# Patient Record
Sex: Female | Born: 1975
Health system: Southern US, Community
[De-identification: ages and names within clinical notes are randomized; demographics above are authoritative.]

## PROBLEM LIST (undated history)

## (undated) DIAGNOSIS — K649 Unspecified hemorrhoids: Secondary | ICD-10-CM

## (undated) DIAGNOSIS — K219 Gastro-esophageal reflux disease without esophagitis: Secondary | ICD-10-CM

## (undated) HISTORY — DX: Unspecified hemorrhoids: K64.9

## (undated) HISTORY — PX: MOUTH SURGERY: SHX715

## (undated) HISTORY — DX: Gastro-esophageal reflux disease without esophagitis: K21.9

---

## 2002-10-08 ENCOUNTER — Emergency Department (HOSPITAL_COMMUNITY): Admission: EM | Admit: 2002-10-08 | Discharge: 2002-10-09 | Payer: Self-pay | Admitting: Emergency Medicine

## 2005-06-17 ENCOUNTER — Other Ambulatory Visit: Admission: RE | Admit: 2005-06-17 | Discharge: 2005-06-17 | Payer: Self-pay | Admitting: Obstetrics and Gynecology

## 2007-04-28 ENCOUNTER — Other Ambulatory Visit: Admission: RE | Admit: 2007-04-28 | Discharge: 2007-04-28 | Payer: Self-pay | Admitting: Family Medicine

## 2015-05-16 ENCOUNTER — Emergency Department (HOSPITAL_BASED_OUTPATIENT_CLINIC_OR_DEPARTMENT_OTHER)
Admission: EM | Admit: 2015-05-16 | Discharge: 2015-05-16 | Disposition: A | Discharge status: Home / Self Care | Payer: No Typology Code available for payment source | Attending: Emergency Medicine | Admitting: Emergency Medicine

## 2015-05-16 ENCOUNTER — Encounter (HOSPITAL_BASED_OUTPATIENT_CLINIC_OR_DEPARTMENT_OTHER): Payer: Self-pay | Admitting: Emergency Medicine

## 2015-05-16 DIAGNOSIS — S40012A Contusion of left shoulder, initial encounter: Secondary | ICD-10-CM | POA: Diagnosis not present

## 2015-05-16 DIAGNOSIS — S99911A Unspecified injury of right ankle, initial encounter: Secondary | ICD-10-CM | POA: Diagnosis present

## 2015-05-16 DIAGNOSIS — Y9389 Activity, other specified: Secondary | ICD-10-CM | POA: Insufficient documentation

## 2015-05-16 DIAGNOSIS — Z880 Allergy status to penicillin: Secondary | ICD-10-CM | POA: Diagnosis not present

## 2015-05-16 DIAGNOSIS — Z79899 Other long term (current) drug therapy: Secondary | ICD-10-CM | POA: Insufficient documentation

## 2015-05-16 DIAGNOSIS — Y92410 Unspecified street and highway as the place of occurrence of the external cause: Secondary | ICD-10-CM | POA: Insufficient documentation

## 2015-05-16 DIAGNOSIS — S93401A Sprain of unspecified ligament of right ankle, initial encounter: Secondary | ICD-10-CM

## 2015-05-16 DIAGNOSIS — Y998 Other external cause status: Secondary | ICD-10-CM | POA: Insufficient documentation

## 2015-05-16 MED ORDER — HYDROCODONE-ACETAMINOPHEN 5-325 MG PO TABS: 1.0000 | ORAL_TABLET | Freq: Four times a day (QID) | ORAL | Status: DC | PRN
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
## 2016-07-23 ENCOUNTER — Ambulatory Visit (HOSPITAL_BASED_OUTPATIENT_CLINIC_OR_DEPARTMENT_OTHER): Payer: BLUE CROSS/BLUE SHIELD

## 2017-11-29 ENCOUNTER — Other Ambulatory Visit: Payer: Self-pay | Admitting: Internal Medicine

## 2017-11-29 DIAGNOSIS — N926 Irregular menstruation, unspecified: Secondary | ICD-10-CM

## 2017-11-29 DIAGNOSIS — R102 Pelvic and perineal pain: Secondary | ICD-10-CM

## 2017-12-07 ENCOUNTER — Ambulatory Visit
Admission: RE | Admit: 2017-12-07 | Discharge: 2017-12-07 | Disposition: A | Discharge status: Home / Self Care | Payer: BLUE CROSS/BLUE SHIELD | Source: Ambulatory Visit | Attending: Internal Medicine | Admitting: Internal Medicine

## 2017-12-07 DIAGNOSIS — N926 Irregular menstruation, unspecified: Secondary | ICD-10-CM

## 2017-12-07 DIAGNOSIS — R102 Pelvic and perineal pain: Secondary | ICD-10-CM

## 2018-04-13 DIAGNOSIS — R51 Headache: Secondary | ICD-10-CM | POA: Diagnosis not present

## 2018-04-19 DIAGNOSIS — G44209 Tension-type headache, unspecified, not intractable: Secondary | ICD-10-CM | POA: Diagnosis not present

## 2018-04-19 DIAGNOSIS — M9902 Segmental and somatic dysfunction of thoracic region: Secondary | ICD-10-CM | POA: Diagnosis not present

## 2018-04-19 DIAGNOSIS — M542 Cervicalgia: Secondary | ICD-10-CM | POA: Diagnosis not present

## 2018-04-19 DIAGNOSIS — M9901 Segmental and somatic dysfunction of cervical region: Secondary | ICD-10-CM | POA: Diagnosis not present

## 2018-04-28 DIAGNOSIS — Z7689 Persons encountering health services in other specified circumstances: Secondary | ICD-10-CM | POA: Diagnosis not present

## 2018-04-28 DIAGNOSIS — J302 Other seasonal allergic rhinitis: Secondary | ICD-10-CM | POA: Diagnosis not present

## 2018-05-10 DIAGNOSIS — M9903 Segmental and somatic dysfunction of lumbar region: Secondary | ICD-10-CM | POA: Diagnosis not present

## 2018-05-10 DIAGNOSIS — M47816 Spondylosis without myelopathy or radiculopathy, lumbar region: Secondary | ICD-10-CM | POA: Diagnosis not present

## 2018-05-10 DIAGNOSIS — M9901 Segmental and somatic dysfunction of cervical region: Secondary | ICD-10-CM | POA: Diagnosis not present

## 2018-05-10 DIAGNOSIS — M542 Cervicalgia: Secondary | ICD-10-CM | POA: Diagnosis not present

## 2018-06-10 DIAGNOSIS — M47816 Spondylosis without myelopathy or radiculopathy, lumbar region: Secondary | ICD-10-CM | POA: Diagnosis not present

## 2018-06-10 DIAGNOSIS — M9903 Segmental and somatic dysfunction of lumbar region: Secondary | ICD-10-CM | POA: Diagnosis not present

## 2018-06-10 DIAGNOSIS — M9901 Segmental and somatic dysfunction of cervical region: Secondary | ICD-10-CM | POA: Diagnosis not present

## 2018-06-10 DIAGNOSIS — M542 Cervicalgia: Secondary | ICD-10-CM | POA: Diagnosis not present

## 2018-06-25 DIAGNOSIS — R5383 Other fatigue: Secondary | ICD-10-CM | POA: Diagnosis not present

## 2018-07-01 DIAGNOSIS — Z0000 Encounter for general adult medical examination without abnormal findings: Secondary | ICD-10-CM | POA: Diagnosis not present

## 2018-07-01 DIAGNOSIS — E559 Vitamin D deficiency, unspecified: Secondary | ICD-10-CM | POA: Diagnosis not present

## 2018-07-14 DIAGNOSIS — M47816 Spondylosis without myelopathy or radiculopathy, lumbar region: Secondary | ICD-10-CM | POA: Diagnosis not present

## 2018-07-14 DIAGNOSIS — M542 Cervicalgia: Secondary | ICD-10-CM | POA: Diagnosis not present

## 2018-07-14 DIAGNOSIS — M9901 Segmental and somatic dysfunction of cervical region: Secondary | ICD-10-CM | POA: Diagnosis not present

## 2018-07-14 DIAGNOSIS — M9903 Segmental and somatic dysfunction of lumbar region: Secondary | ICD-10-CM | POA: Diagnosis not present

## 2018-08-25 DIAGNOSIS — M9903 Segmental and somatic dysfunction of lumbar region: Secondary | ICD-10-CM | POA: Diagnosis not present

## 2018-08-25 DIAGNOSIS — M542 Cervicalgia: Secondary | ICD-10-CM | POA: Diagnosis not present

## 2018-08-25 DIAGNOSIS — M47816 Spondylosis without myelopathy or radiculopathy, lumbar region: Secondary | ICD-10-CM | POA: Diagnosis not present

## 2018-08-25 DIAGNOSIS — M9901 Segmental and somatic dysfunction of cervical region: Secondary | ICD-10-CM | POA: Diagnosis not present

## 2018-09-18 IMAGING — US US PELVIS COMPLETE TRANSABD/TRANSVAG
1 series · 14 of 25 positions shown · non-contrast
Comparison: None

CLINICAL DATA: Irregular menses.  Pelvic pain for 2 months.

EXAM:
TRANSABDOMINAL AND TRANSVAGINAL ULTRASOUND OF PELVIS
TECHNIQUE: Both transabdominal and transvaginal ultrasound examinations of the
pelvis were performed. Transabdominal technique was performed for
global imaging of the pelvis including uterus, ovaries, adnexal
regions, and pelvic cul-de-sac. It was necessary to proceed with
endovaginal exam following the transabdominal exam to visualize the
endometrium and ovaries.

[Series 1: us pelvis complete transabd/transvag · 0.17mm/px · 14 of 77 slices shown]
[im 1/77]
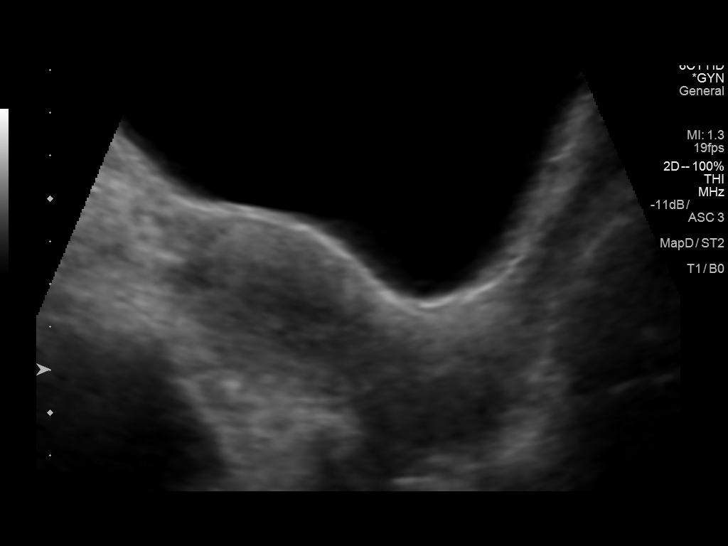
[im 7/77]
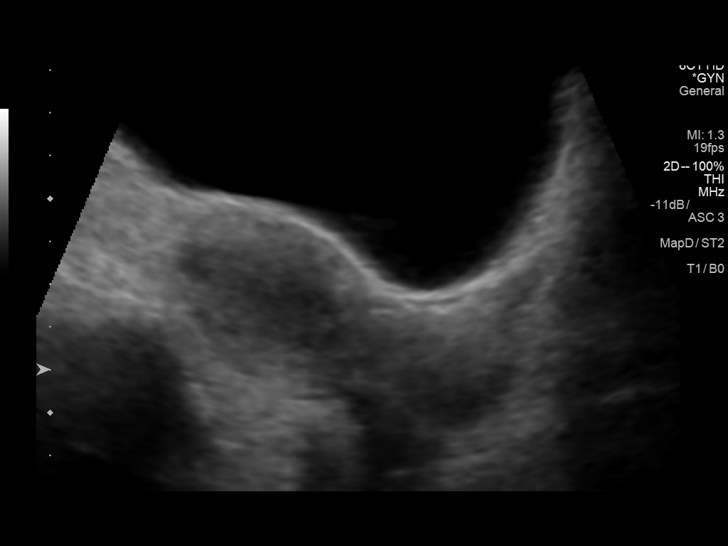
[im 13/77]
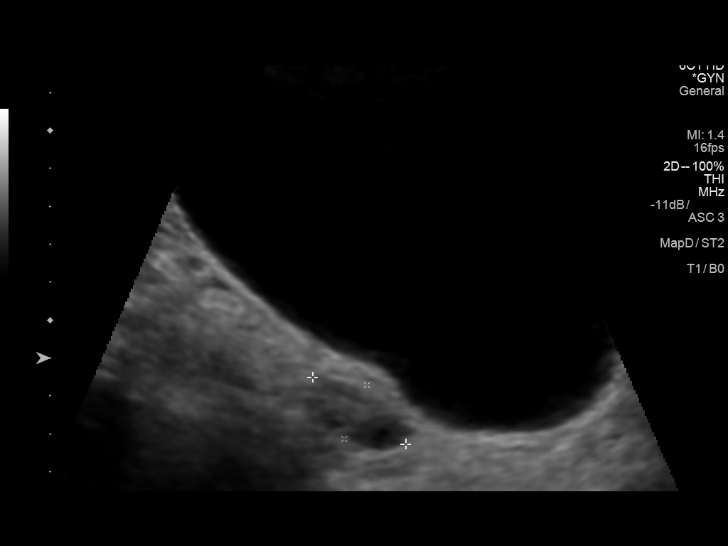
[im 20/77]
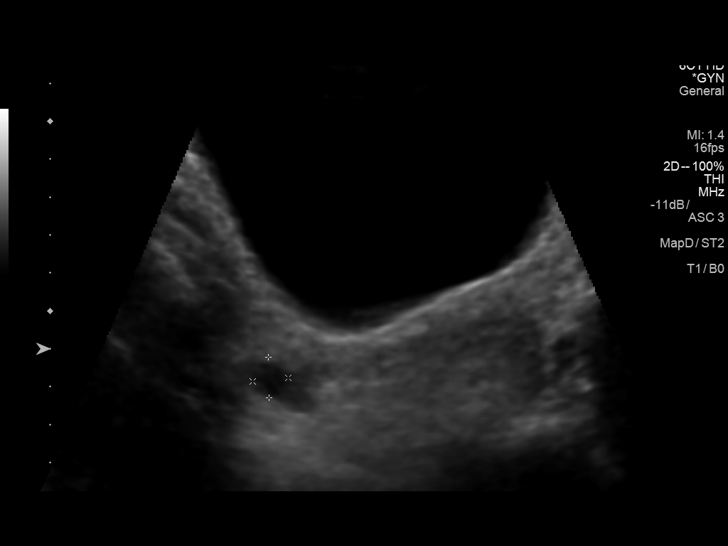
[im 26/77]
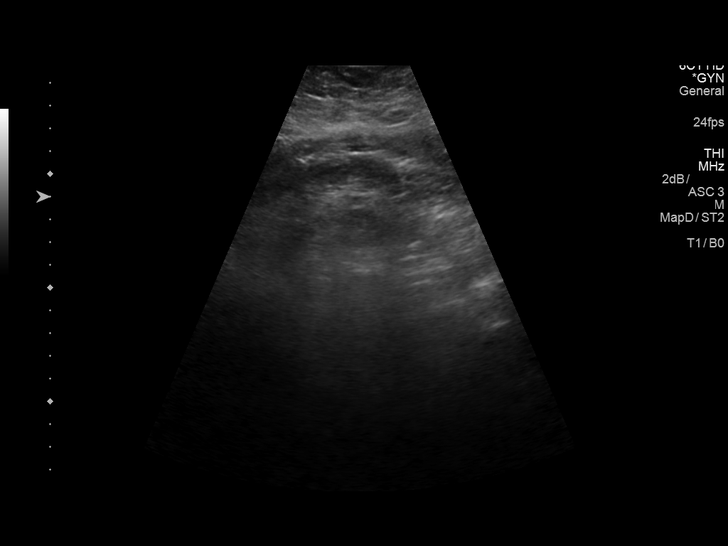
[im 29/77]
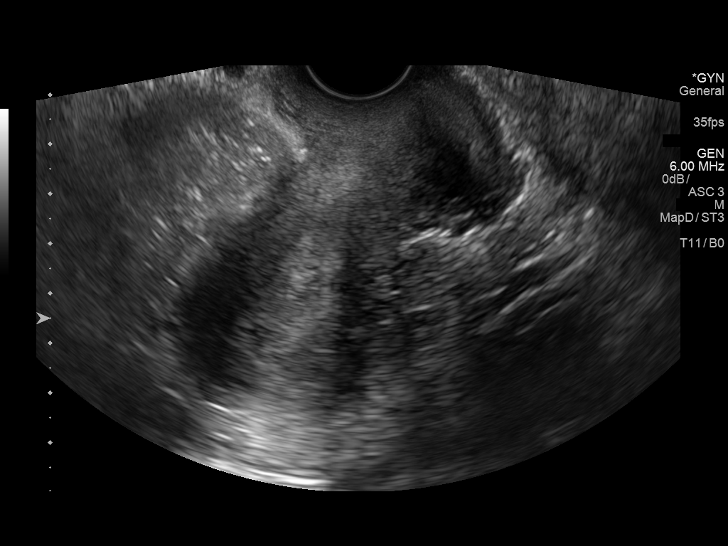
[im 35/77]
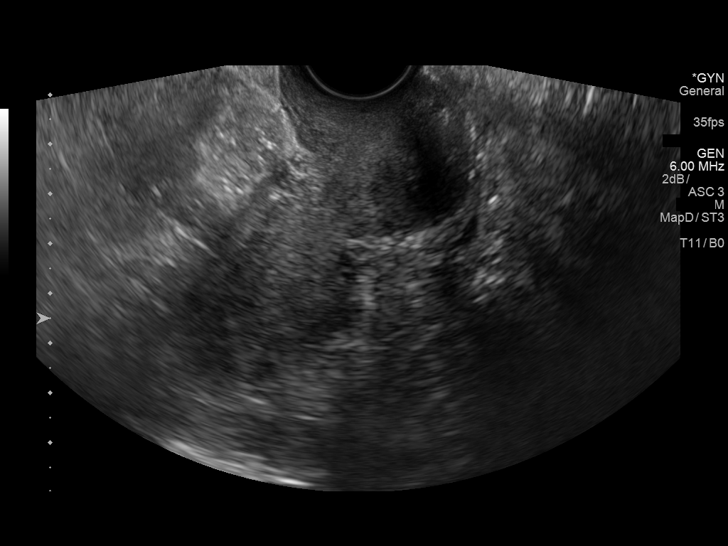
[im 42/77]
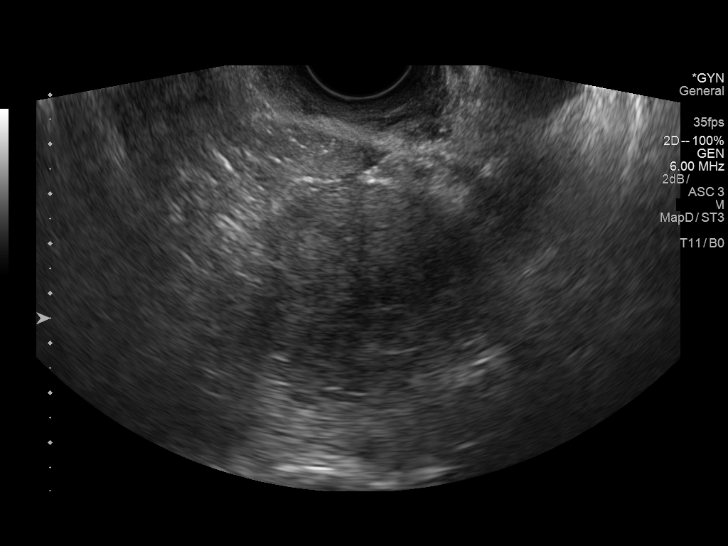
[im 48/77]
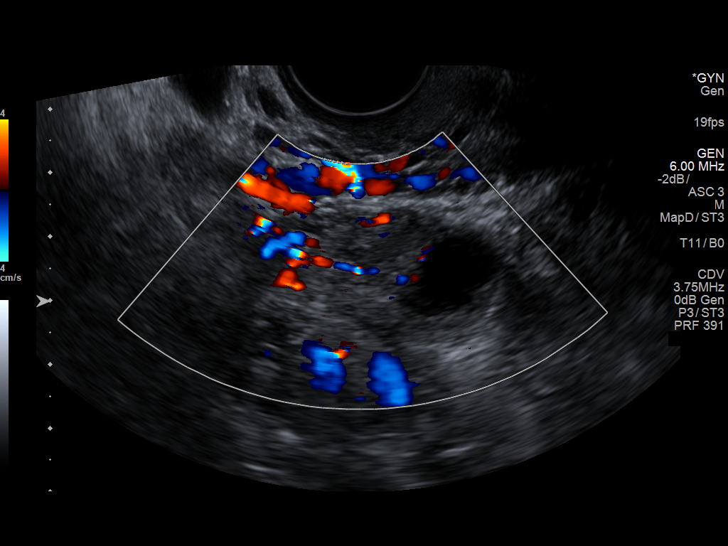
[im 51/77]
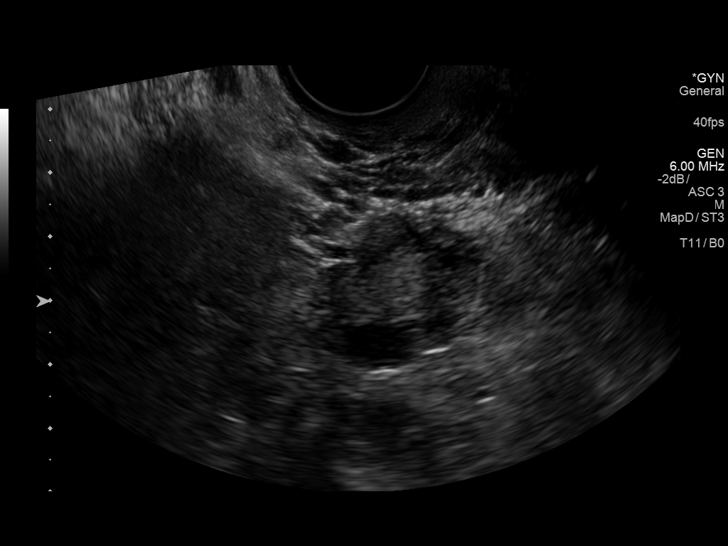
[im 58/77]
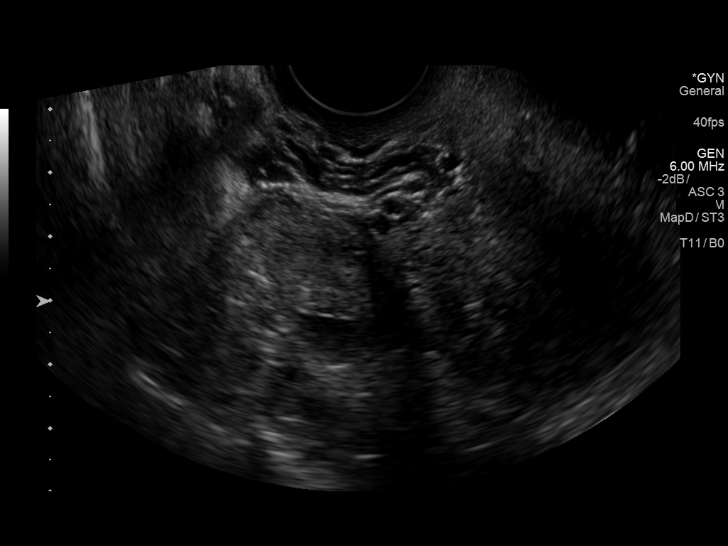
[im 64/77]
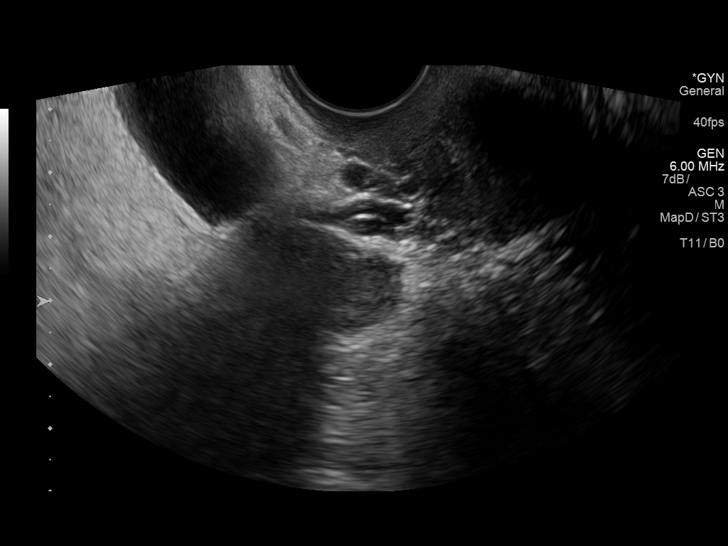
[im 70/77]
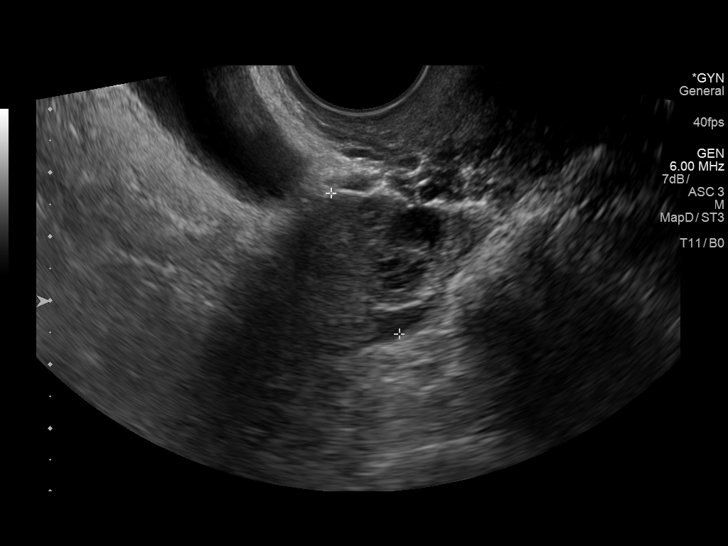
[im 77/77]
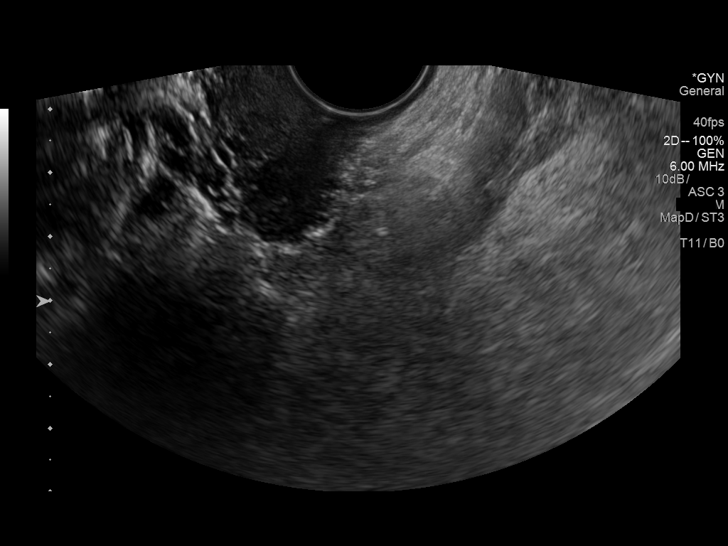

[14 of 25 positions shown; findings below may reference images not displayed]

FINDINGS: Uterus

Measurements: 7.9 x 4.2 x 5.0 cm. No fibroids or other mass
visualized.

Endometrium

Thickness: 9.5 mm.  No focal abnormality visualized.

Right ovary

Measurements: 4.2 x 2.4 x 2.1 cm. Normal appearance/no adnexal mass.

Left ovary

Measurements: 4.1 x 2.2 x 2.5 cm. Normal appearance/no adnexal mass.

Other findings

No abnormal free fluid.
IMPRESSION: Normal study. No cause for the patient's irregular menses or pelvic
pain identified.

## 2018-09-23 DIAGNOSIS — M9903 Segmental and somatic dysfunction of lumbar region: Secondary | ICD-10-CM | POA: Diagnosis not present

## 2018-09-23 DIAGNOSIS — M9901 Segmental and somatic dysfunction of cervical region: Secondary | ICD-10-CM | POA: Diagnosis not present

## 2018-09-23 DIAGNOSIS — M47816 Spondylosis without myelopathy or radiculopathy, lumbar region: Secondary | ICD-10-CM | POA: Diagnosis not present

## 2018-09-23 DIAGNOSIS — M542 Cervicalgia: Secondary | ICD-10-CM | POA: Diagnosis not present

## 2018-10-13 DIAGNOSIS — Z713 Dietary counseling and surveillance: Secondary | ICD-10-CM | POA: Diagnosis not present

## 2018-10-13 DIAGNOSIS — E663 Overweight: Secondary | ICD-10-CM | POA: Diagnosis not present

## 2018-11-17 DIAGNOSIS — Z713 Dietary counseling and surveillance: Secondary | ICD-10-CM | POA: Diagnosis not present

## 2018-11-17 DIAGNOSIS — E663 Overweight: Secondary | ICD-10-CM | POA: Diagnosis not present

## 2019-04-26 DIAGNOSIS — E559 Vitamin D deficiency, unspecified: Secondary | ICD-10-CM | POA: Diagnosis not present

## 2019-04-26 DIAGNOSIS — Z760 Encounter for issue of repeat prescription: Secondary | ICD-10-CM | POA: Diagnosis not present

## 2019-04-26 DIAGNOSIS — Z7689 Persons encountering health services in other specified circumstances: Secondary | ICD-10-CM | POA: Diagnosis not present

## 2019-07-19 DIAGNOSIS — Z0000 Encounter for general adult medical examination without abnormal findings: Secondary | ICD-10-CM | POA: Diagnosis not present

## 2019-07-19 DIAGNOSIS — Z23 Encounter for immunization: Secondary | ICD-10-CM | POA: Diagnosis not present

## 2019-07-19 DIAGNOSIS — Z1239 Encounter for other screening for malignant neoplasm of breast: Secondary | ICD-10-CM | POA: Diagnosis not present

## 2019-07-19 DIAGNOSIS — Z1159 Encounter for screening for other viral diseases: Secondary | ICD-10-CM | POA: Diagnosis not present

## 2019-07-22 DIAGNOSIS — Z23 Encounter for immunization: Secondary | ICD-10-CM | POA: Diagnosis not present

## 2019-07-23 DIAGNOSIS — Z20828 Contact with and (suspected) exposure to other viral communicable diseases: Secondary | ICD-10-CM | POA: Diagnosis not present

## 2019-07-23 DIAGNOSIS — Z7189 Other specified counseling: Secondary | ICD-10-CM | POA: Diagnosis not present

## 2019-08-10 DIAGNOSIS — Z1239 Encounter for other screening for malignant neoplasm of breast: Secondary | ICD-10-CM | POA: Diagnosis not present

## 2019-08-10 DIAGNOSIS — Z1231 Encounter for screening mammogram for malignant neoplasm of breast: Secondary | ICD-10-CM | POA: Diagnosis not present

## 2020-02-09 DIAGNOSIS — K5901 Slow transit constipation: Secondary | ICD-10-CM | POA: Diagnosis not present

## 2020-02-09 DIAGNOSIS — R82998 Other abnormal findings in urine: Secondary | ICD-10-CM | POA: Diagnosis not present

## 2020-02-09 DIAGNOSIS — R109 Unspecified abdominal pain: Secondary | ICD-10-CM | POA: Diagnosis not present

## 2020-03-09 DIAGNOSIS — Z23 Encounter for immunization: Secondary | ICD-10-CM | POA: Diagnosis not present

## 2020-04-06 DIAGNOSIS — Z23 Encounter for immunization: Secondary | ICD-10-CM | POA: Diagnosis not present

## 2020-06-17 DIAGNOSIS — Z20822 Contact with and (suspected) exposure to covid-19: Secondary | ICD-10-CM | POA: Diagnosis not present

## 2020-07-20 DIAGNOSIS — Z23 Encounter for immunization: Secondary | ICD-10-CM | POA: Diagnosis not present

## 2020-08-11 DIAGNOSIS — Z03818 Encounter for observation for suspected exposure to other biological agents ruled out: Secondary | ICD-10-CM | POA: Diagnosis not present

## 2020-08-13 DIAGNOSIS — Z03818 Encounter for observation for suspected exposure to other biological agents ruled out: Secondary | ICD-10-CM | POA: Diagnosis not present

## 2020-11-16 DIAGNOSIS — Z23 Encounter for immunization: Secondary | ICD-10-CM | POA: Diagnosis not present

## 2020-11-27 DIAGNOSIS — M9901 Segmental and somatic dysfunction of cervical region: Secondary | ICD-10-CM | POA: Diagnosis not present

## 2020-11-27 DIAGNOSIS — M9903 Segmental and somatic dysfunction of lumbar region: Secondary | ICD-10-CM | POA: Diagnosis not present

## 2020-11-27 DIAGNOSIS — M542 Cervicalgia: Secondary | ICD-10-CM | POA: Diagnosis not present

## 2020-11-27 DIAGNOSIS — M47816 Spondylosis without myelopathy or radiculopathy, lumbar region: Secondary | ICD-10-CM | POA: Diagnosis not present

## 2020-12-10 DIAGNOSIS — M47816 Spondylosis without myelopathy or radiculopathy, lumbar region: Secondary | ICD-10-CM | POA: Diagnosis not present

## 2020-12-10 DIAGNOSIS — M9903 Segmental and somatic dysfunction of lumbar region: Secondary | ICD-10-CM | POA: Diagnosis not present

## 2020-12-10 DIAGNOSIS — M9901 Segmental and somatic dysfunction of cervical region: Secondary | ICD-10-CM | POA: Diagnosis not present

## 2020-12-10 DIAGNOSIS — M542 Cervicalgia: Secondary | ICD-10-CM | POA: Diagnosis not present

## 2020-12-24 DIAGNOSIS — M542 Cervicalgia: Secondary | ICD-10-CM | POA: Diagnosis not present

## 2020-12-24 DIAGNOSIS — M9901 Segmental and somatic dysfunction of cervical region: Secondary | ICD-10-CM | POA: Diagnosis not present

## 2020-12-24 DIAGNOSIS — M47816 Spondylosis without myelopathy or radiculopathy, lumbar region: Secondary | ICD-10-CM | POA: Diagnosis not present

## 2020-12-24 DIAGNOSIS — M9903 Segmental and somatic dysfunction of lumbar region: Secondary | ICD-10-CM | POA: Diagnosis not present

## 2021-01-10 DIAGNOSIS — M9901 Segmental and somatic dysfunction of cervical region: Secondary | ICD-10-CM | POA: Diagnosis not present

## 2021-01-10 DIAGNOSIS — M9903 Segmental and somatic dysfunction of lumbar region: Secondary | ICD-10-CM | POA: Diagnosis not present

## 2021-01-10 DIAGNOSIS — M542 Cervicalgia: Secondary | ICD-10-CM | POA: Diagnosis not present

## 2021-01-10 DIAGNOSIS — M47816 Spondylosis without myelopathy or radiculopathy, lumbar region: Secondary | ICD-10-CM | POA: Diagnosis not present

## 2021-01-25 DIAGNOSIS — Z20822 Contact with and (suspected) exposure to covid-19: Secondary | ICD-10-CM | POA: Diagnosis not present

## 2021-01-29 DIAGNOSIS — J069 Acute upper respiratory infection, unspecified: Secondary | ICD-10-CM | POA: Diagnosis not present

## 2021-01-29 DIAGNOSIS — J039 Acute tonsillitis, unspecified: Secondary | ICD-10-CM | POA: Diagnosis not present

## 2021-02-07 DIAGNOSIS — M9901 Segmental and somatic dysfunction of cervical region: Secondary | ICD-10-CM | POA: Diagnosis not present

## 2021-02-07 DIAGNOSIS — M542 Cervicalgia: Secondary | ICD-10-CM | POA: Diagnosis not present

## 2021-02-07 DIAGNOSIS — M9903 Segmental and somatic dysfunction of lumbar region: Secondary | ICD-10-CM | POA: Diagnosis not present

## 2021-02-07 DIAGNOSIS — M47816 Spondylosis without myelopathy or radiculopathy, lumbar region: Secondary | ICD-10-CM | POA: Diagnosis not present

## 2021-04-10 DIAGNOSIS — M542 Cervicalgia: Secondary | ICD-10-CM | POA: Diagnosis not present

## 2021-04-10 DIAGNOSIS — M9901 Segmental and somatic dysfunction of cervical region: Secondary | ICD-10-CM | POA: Diagnosis not present

## 2021-04-10 DIAGNOSIS — M47816 Spondylosis without myelopathy or radiculopathy, lumbar region: Secondary | ICD-10-CM | POA: Diagnosis not present

## 2021-04-10 DIAGNOSIS — M9903 Segmental and somatic dysfunction of lumbar region: Secondary | ICD-10-CM | POA: Diagnosis not present

## 2021-05-09 DIAGNOSIS — M9903 Segmental and somatic dysfunction of lumbar region: Secondary | ICD-10-CM | POA: Diagnosis not present

## 2021-05-09 DIAGNOSIS — M47816 Spondylosis without myelopathy or radiculopathy, lumbar region: Secondary | ICD-10-CM | POA: Diagnosis not present

## 2021-05-09 DIAGNOSIS — M542 Cervicalgia: Secondary | ICD-10-CM | POA: Diagnosis not present

## 2021-05-09 DIAGNOSIS — M9901 Segmental and somatic dysfunction of cervical region: Secondary | ICD-10-CM | POA: Diagnosis not present

## 2021-06-06 DIAGNOSIS — M9901 Segmental and somatic dysfunction of cervical region: Secondary | ICD-10-CM | POA: Diagnosis not present

## 2021-06-06 DIAGNOSIS — M47816 Spondylosis without myelopathy or radiculopathy, lumbar region: Secondary | ICD-10-CM | POA: Diagnosis not present

## 2021-06-06 DIAGNOSIS — M542 Cervicalgia: Secondary | ICD-10-CM | POA: Diagnosis not present

## 2021-06-06 DIAGNOSIS — M9903 Segmental and somatic dysfunction of lumbar region: Secondary | ICD-10-CM | POA: Diagnosis not present

## 2021-07-09 ENCOUNTER — Encounter: Payer: Self-pay | Admitting: Physician Assistant

## 2021-07-22 DIAGNOSIS — M542 Cervicalgia: Secondary | ICD-10-CM | POA: Diagnosis not present

## 2021-07-22 DIAGNOSIS — M9903 Segmental and somatic dysfunction of lumbar region: Secondary | ICD-10-CM | POA: Diagnosis not present

## 2021-07-22 DIAGNOSIS — M47816 Spondylosis without myelopathy or radiculopathy, lumbar region: Secondary | ICD-10-CM | POA: Diagnosis not present

## 2021-07-22 DIAGNOSIS — M9901 Segmental and somatic dysfunction of cervical region: Secondary | ICD-10-CM | POA: Diagnosis not present

## 2021-08-07 ENCOUNTER — Encounter: Payer: Self-pay | Admitting: Physician Assistant

## 2021-08-07 ENCOUNTER — Ambulatory Visit: Payer: 59 | Admitting: Physician Assistant

## 2021-08-07 VITALS — BP 124/70 | HR 70 | Ht 67.5 in | Wt 208.0 lb

## 2021-08-07 DIAGNOSIS — K640 First degree hemorrhoids: Secondary | ICD-10-CM | POA: Diagnosis not present

## 2021-08-07 DIAGNOSIS — K644 Residual hemorrhoidal skin tags: Secondary | ICD-10-CM | POA: Diagnosis not present

## 2021-08-07 DIAGNOSIS — K219 Gastro-esophageal reflux disease without esophagitis: Secondary | ICD-10-CM | POA: Diagnosis not present

## 2021-08-07 NOTE — Patient Instructions (Addendum)
Start Benefiber or Citrucel 2 teaspoons in 8 ounces of liquid daily.  Increase water intake to 8 -8 ounce glasses daily.   Referral placed to [facility].   If you are age 43 or older, your body mass index should be between 23-30. Your Body mass index is 32.1 kg/m. If this is out of the aforementioned range listed, please consider follow up with your Primary Care Provider.  If you are age 67 or younger, your body mass index should be between 19-25. Your Body mass index is 32.1 kg/m. If this is out of the aformentioned range listed, please consider follow up with your Primary Care Provider.   __________________________________________________________  The [facility] providers would like to encourage you to use [facility] to communicate with providers for non-urgent requests or questions.  Due to long hold times on the telephone, sending your provider a message by [facility] may be a faster and more efficient way to get a response.  Please allow 48 business hours for a response.  Please remember that this is for non-urgent requests.

## 2021-08-07 NOTE — Progress Notes (Signed)
Reviewed.  Burlene Montecalvo L. Donny Heffern, MD, MPH  

## 2021-08-07 NOTE — Progress Notes (Signed)
Chief Complaint: GERD and hemorrhoid tags  HPI:    Evelyn Matthews is a 45 year old African-American female with a past medical history as listed below, who was referred to me by Jackie Plum, MD for a complaint of GERD and hemorrhoid tags.    Today, the patient tells me that when she called a few weeks back to make the appointment she was having some issues with reflux but she used some over-the-counter Omeprazole 20 mg and "Jesus touched me", and she has had no problems with that over the past month or so.    Primarily why she still kept her appointment was because she describes a history of hemorrhoids, apparently saw gastroenterologist about that 9 years ago or so and was told to use fiber and that they could "cut them out", but she never did anything about them.  Explains that she was constipated at the time.  Now describes that in the morning she will drink a bottle of water or a cup of coffee and will have good bowel movements every day, though sometimes these can be somewhat loose.  Her complaint now is that it is very hard to "clean up", because she has a lot of "tags" back there and spends an extra couple of minutes using wet wipes in order to feel clean.  She is wondering what she can do about these tags.    Denies fever, chills, weight loss, blood in her stool, abdominal pain or symptoms that awaken her from sleep.  Past Medical History:  Diagnosis Date   GERD (gastroesophageal reflux disease)    Hemorrhoids     Past Surgical History:  Procedure Laterality Date   MOUTH SURGERY     wisdom teeth    Current Outpatient Medications  Medication Sig Dispense Refill   cetirizine (ZYRTEC) 10 MG tablet Take 10 mg by mouth daily.     MULTIPLE VITAMIN PO Take 1 capsule by mouth daily.     omeprazole (PRILOSEC) 20 MG capsule Take 20 mg by mouth daily.     OVER THE COUNTER MEDICATION Biotin 10,041mcg daily     VITAMIN D PO Take 1 capsule by mouth daily.     No current  facility-administered medications for this visit.    Allergies as of 08/07/2021 - Review Complete 08/07/2021  Allergen Reaction Noted   Codeine  05/16/2015   Penicillins  05/16/2015    Family History  Problem Relation Age of Onset   Hypertension Mother        smoker   Throat cancer Father        smoker   Colon cancer Neg Hx    Rectal cancer Neg Hx     Social History   Socioeconomic History   Marital status: Single    Spouse name: Not on file   Number of children: 0   Years of education: Not on file   Highest education level: Not on file  Occupational History   Occupation: [employer]  Tobacco Use   Smoking status: Never   Smokeless tobacco: Never  Vaping Use   Vaping Use: Never used  Substance and Sexual Activity   Alcohol use: Never   Drug use: Never   Sexual activity: Not on file  Other Topics Concern   Not on file  Social History Narrative   Not on file   Social Determinants of Health   Financial Resource Strain: Not on file  Food Insecurity: Not on file  Transportation Needs: Not on file  Physical Activity:  Not on file  Stress: Not on file  Social Connections: Not on file  Intimate Partner Violence: Not on file    Review of Systems:    Constitutional: No weight loss, fever or chills Skin: No rash  Cardiovascular: No chest pain Respiratory: No SOB Gastrointestinal: See HPI and otherwise negative Genitourinary: No dysuria Neurological: No headache, dizziness or syncope Musculoskeletal: No new muscle or joint pain Hematologic: No bleeding  Psychiatric: No history of depression or anxiety   Physical Exam:  Vital signs: BP 124/70   Pulse 70   Ht 5' 7.5" (1.715 m)   Wt 208 lb (94.3 kg)   BMI 32.10 kg/m   Constitutional:  Very Pleasant AA female appears to be in NAD, Well developed, Well nourished, alert and cooperative Head:  Normocephalic and atraumatic. Eyes:   PEERL, EOMI. No icterus. Conjunctiva pink. Ears:  Normal auditory acuity. Neck:   Supple Throat: Oral cavity and pharynx without inflammation, swelling or lesion.  Respiratory: Respirations even and unlabored. Lungs clear to auscultation bilaterally.   No wheezes, crackles, or rhonchi.  Cardiovascular: Normal S1, S2. No MRG. Regular rate and rhythm. No peripheral edema, cyanosis or pallor.  Gastrointestinal:  Soft, nondistended, nontender. No rebound or guarding. Normal bowel sounds. No appreciable masses or hepatomegaly. Rectal: External: Multiple hemorrhoid tags; internal: Increased sphincter tone; anoscopy: Grade 1 hemorrhoids Msk:  Symmetrical without gross deformities. Without edema, no deformity or joint abnormality.  Neurologic:  Alert and  oriented x4;  grossly normal neurologically.  Skin:   Dry and intact without significant lesions or rashes. Psychiatric: Demonstrates good judgement and reason without abnormal affect or behaviors.  No recent labs or imaging.  Assessment: 1.  External hemorrhoids/tags: Seen on exam today, likely making it hard for her to clean 2.  Grade 1 hemorrhoids: No bleeding, likely from prior constipation 3.  GERD: Better now after some over-the-counter Omeprazole  Plan: 1.  Discussed with patient that in order to remove her external tags she would need surgery.  She would like referral.  Sent referral to [facility]. 2.  Recommend the patient restart her daily fiber supplementation to help keep her stools more formed so that she does not have to deal with such medicines. 3.  Patient to follow in clinic as needed in the future.  She was assigned to Dr. Orvan Falconer this morning as she requested a female physician.  Hyacinth Meeker, PA-C [facility] 08/07/2021, 1:15 PM  Cc: Jackie Plum, MD

## 2021-08-21 DIAGNOSIS — M542 Cervicalgia: Secondary | ICD-10-CM | POA: Diagnosis not present

## 2021-08-21 DIAGNOSIS — M47816 Spondylosis without myelopathy or radiculopathy, lumbar region: Secondary | ICD-10-CM | POA: Diagnosis not present

## 2021-08-21 DIAGNOSIS — M9901 Segmental and somatic dysfunction of cervical region: Secondary | ICD-10-CM | POA: Diagnosis not present

## 2021-08-21 DIAGNOSIS — M9903 Segmental and somatic dysfunction of lumbar region: Secondary | ICD-10-CM | POA: Diagnosis not present

## 2021-09-02 DIAGNOSIS — Z23 Encounter for immunization: Secondary | ICD-10-CM | POA: Diagnosis not present

## 2021-10-09 DIAGNOSIS — M47816 Spondylosis without myelopathy or radiculopathy, lumbar region: Secondary | ICD-10-CM | POA: Diagnosis not present

## 2021-10-09 DIAGNOSIS — M542 Cervicalgia: Secondary | ICD-10-CM | POA: Diagnosis not present

## 2021-10-09 DIAGNOSIS — M9903 Segmental and somatic dysfunction of lumbar region: Secondary | ICD-10-CM | POA: Diagnosis not present

## 2021-10-09 DIAGNOSIS — M9901 Segmental and somatic dysfunction of cervical region: Secondary | ICD-10-CM | POA: Diagnosis not present

## 2021-10-24 DIAGNOSIS — U071 COVID-19: Secondary | ICD-10-CM | POA: Diagnosis not present

## 2021-11-06 DIAGNOSIS — M9905 Segmental and somatic dysfunction of pelvic region: Secondary | ICD-10-CM | POA: Diagnosis not present

## 2021-11-06 DIAGNOSIS — M9901 Segmental and somatic dysfunction of cervical region: Secondary | ICD-10-CM | POA: Diagnosis not present

## 2021-11-06 DIAGNOSIS — M9902 Segmental and somatic dysfunction of thoracic region: Secondary | ICD-10-CM | POA: Diagnosis not present

## 2021-11-06 DIAGNOSIS — M9903 Segmental and somatic dysfunction of lumbar region: Secondary | ICD-10-CM | POA: Diagnosis not present

## 2021-12-04 DIAGNOSIS — M9903 Segmental and somatic dysfunction of lumbar region: Secondary | ICD-10-CM | POA: Diagnosis not present

## 2021-12-04 DIAGNOSIS — M9905 Segmental and somatic dysfunction of pelvic region: Secondary | ICD-10-CM | POA: Diagnosis not present

## 2021-12-04 DIAGNOSIS — M9901 Segmental and somatic dysfunction of cervical region: Secondary | ICD-10-CM | POA: Diagnosis not present

## 2021-12-04 DIAGNOSIS — M9902 Segmental and somatic dysfunction of thoracic region: Secondary | ICD-10-CM | POA: Diagnosis not present

## 2022-01-01 DIAGNOSIS — M9905 Segmental and somatic dysfunction of pelvic region: Secondary | ICD-10-CM | POA: Diagnosis not present

## 2022-01-01 DIAGNOSIS — M9901 Segmental and somatic dysfunction of cervical region: Secondary | ICD-10-CM | POA: Diagnosis not present

## 2022-01-01 DIAGNOSIS — M9902 Segmental and somatic dysfunction of thoracic region: Secondary | ICD-10-CM | POA: Diagnosis not present

## 2022-01-01 DIAGNOSIS — M9903 Segmental and somatic dysfunction of lumbar region: Secondary | ICD-10-CM | POA: Diagnosis not present

## 2022-01-30 DIAGNOSIS — M9901 Segmental and somatic dysfunction of cervical region: Secondary | ICD-10-CM | POA: Diagnosis not present

## 2022-01-30 DIAGNOSIS — M9902 Segmental and somatic dysfunction of thoracic region: Secondary | ICD-10-CM | POA: Diagnosis not present

## 2022-01-30 DIAGNOSIS — M9905 Segmental and somatic dysfunction of pelvic region: Secondary | ICD-10-CM | POA: Diagnosis not present

## 2022-01-30 DIAGNOSIS — M9903 Segmental and somatic dysfunction of lumbar region: Secondary | ICD-10-CM | POA: Diagnosis not present

## 2022-02-28 DIAGNOSIS — M9905 Segmental and somatic dysfunction of pelvic region: Secondary | ICD-10-CM | POA: Diagnosis not present

## 2022-02-28 DIAGNOSIS — M9901 Segmental and somatic dysfunction of cervical region: Secondary | ICD-10-CM | POA: Diagnosis not present

## 2022-02-28 DIAGNOSIS — M9902 Segmental and somatic dysfunction of thoracic region: Secondary | ICD-10-CM | POA: Diagnosis not present

## 2022-02-28 DIAGNOSIS — M9903 Segmental and somatic dysfunction of lumbar region: Secondary | ICD-10-CM | POA: Diagnosis not present

## 2022-03-28 DIAGNOSIS — J00 Acute nasopharyngitis [common cold]: Secondary | ICD-10-CM | POA: Diagnosis not present

## 2022-04-03 DIAGNOSIS — Z880 Allergy status to penicillin: Secondary | ICD-10-CM | POA: Diagnosis not present

## 2022-04-03 DIAGNOSIS — J019 Acute sinusitis, unspecified: Secondary | ICD-10-CM | POA: Diagnosis not present

## 2022-04-07 DIAGNOSIS — M9903 Segmental and somatic dysfunction of lumbar region: Secondary | ICD-10-CM | POA: Diagnosis not present

## 2022-04-07 DIAGNOSIS — M9901 Segmental and somatic dysfunction of cervical region: Secondary | ICD-10-CM | POA: Diagnosis not present

## 2022-04-07 DIAGNOSIS — M9905 Segmental and somatic dysfunction of pelvic region: Secondary | ICD-10-CM | POA: Diagnosis not present

## 2022-04-07 DIAGNOSIS — M9902 Segmental and somatic dysfunction of thoracic region: Secondary | ICD-10-CM | POA: Diagnosis not present

## 2022-05-12 DIAGNOSIS — M9903 Segmental and somatic dysfunction of lumbar region: Secondary | ICD-10-CM | POA: Diagnosis not present

## 2022-05-12 DIAGNOSIS — M9905 Segmental and somatic dysfunction of pelvic region: Secondary | ICD-10-CM | POA: Diagnosis not present

## 2022-05-12 DIAGNOSIS — M9901 Segmental and somatic dysfunction of cervical region: Secondary | ICD-10-CM | POA: Diagnosis not present

## 2022-05-12 DIAGNOSIS — M9902 Segmental and somatic dysfunction of thoracic region: Secondary | ICD-10-CM | POA: Diagnosis not present

## 2022-05-16 DIAGNOSIS — M25511 Pain in right shoulder: Secondary | ICD-10-CM | POA: Diagnosis not present

## 2022-06-09 DIAGNOSIS — M9903 Segmental and somatic dysfunction of lumbar region: Secondary | ICD-10-CM | POA: Diagnosis not present

## 2022-06-09 DIAGNOSIS — M9905 Segmental and somatic dysfunction of pelvic region: Secondary | ICD-10-CM | POA: Diagnosis not present

## 2022-06-09 DIAGNOSIS — M9901 Segmental and somatic dysfunction of cervical region: Secondary | ICD-10-CM | POA: Diagnosis not present

## 2022-06-09 DIAGNOSIS — M9902 Segmental and somatic dysfunction of thoracic region: Secondary | ICD-10-CM | POA: Diagnosis not present

## 2022-08-19 DIAGNOSIS — Z1231 Encounter for screening mammogram for malignant neoplasm of breast: Secondary | ICD-10-CM | POA: Diagnosis not present

## 2022-08-19 DIAGNOSIS — Z23 Encounter for immunization: Secondary | ICD-10-CM | POA: Diagnosis not present

## 2022-08-19 DIAGNOSIS — Z0000 Encounter for general adult medical examination without abnormal findings: Secondary | ICD-10-CM | POA: Diagnosis not present

## 2022-08-19 DIAGNOSIS — N939 Abnormal uterine and vaginal bleeding, unspecified: Secondary | ICD-10-CM | POA: Diagnosis not present

## 2022-08-25 DIAGNOSIS — D259 Leiomyoma of uterus, unspecified: Secondary | ICD-10-CM | POA: Diagnosis not present

## 2022-08-25 DIAGNOSIS — Z1231 Encounter for screening mammogram for malignant neoplasm of breast: Secondary | ICD-10-CM | POA: Diagnosis not present

## 2022-08-25 DIAGNOSIS — D250 Submucous leiomyoma of uterus: Secondary | ICD-10-CM | POA: Diagnosis not present

## 2022-09-22 DIAGNOSIS — M9905 Segmental and somatic dysfunction of pelvic region: Secondary | ICD-10-CM | POA: Diagnosis not present

## 2022-09-22 DIAGNOSIS — M9901 Segmental and somatic dysfunction of cervical region: Secondary | ICD-10-CM | POA: Diagnosis not present

## 2022-09-22 DIAGNOSIS — M9903 Segmental and somatic dysfunction of lumbar region: Secondary | ICD-10-CM | POA: Diagnosis not present

## 2022-09-22 DIAGNOSIS — M9902 Segmental and somatic dysfunction of thoracic region: Secondary | ICD-10-CM | POA: Diagnosis not present

## 2022-09-30 DIAGNOSIS — D250 Submucous leiomyoma of uterus: Secondary | ICD-10-CM | POA: Diagnosis not present

## 2022-09-30 DIAGNOSIS — D251 Intramural leiomyoma of uterus: Secondary | ICD-10-CM | POA: Diagnosis not present

## 2022-11-17 DIAGNOSIS — M9903 Segmental and somatic dysfunction of lumbar region: Secondary | ICD-10-CM | POA: Diagnosis not present

## 2022-11-17 DIAGNOSIS — M9905 Segmental and somatic dysfunction of pelvic region: Secondary | ICD-10-CM | POA: Diagnosis not present

## 2022-11-17 DIAGNOSIS — M9902 Segmental and somatic dysfunction of thoracic region: Secondary | ICD-10-CM | POA: Diagnosis not present

## 2022-11-17 DIAGNOSIS — M9901 Segmental and somatic dysfunction of cervical region: Secondary | ICD-10-CM | POA: Diagnosis not present

## 2022-11-19 DIAGNOSIS — D219 Benign neoplasm of connective and other soft tissue, unspecified: Secondary | ICD-10-CM | POA: Diagnosis not present

## 2022-11-19 DIAGNOSIS — E782 Mixed hyperlipidemia: Secondary | ICD-10-CM | POA: Diagnosis not present

## 2022-11-19 DIAGNOSIS — R7303 Prediabetes: Secondary | ICD-10-CM | POA: Diagnosis not present

## 2022-12-29 DIAGNOSIS — M9901 Segmental and somatic dysfunction of cervical region: Secondary | ICD-10-CM | POA: Diagnosis not present

## 2022-12-29 DIAGNOSIS — M9905 Segmental and somatic dysfunction of pelvic region: Secondary | ICD-10-CM | POA: Diagnosis not present

## 2022-12-29 DIAGNOSIS — M9902 Segmental and somatic dysfunction of thoracic region: Secondary | ICD-10-CM | POA: Diagnosis not present

## 2022-12-29 DIAGNOSIS — M9903 Segmental and somatic dysfunction of lumbar region: Secondary | ICD-10-CM | POA: Diagnosis not present

## 2023-01-26 DIAGNOSIS — M9902 Segmental and somatic dysfunction of thoracic region: Secondary | ICD-10-CM | POA: Diagnosis not present

## 2023-01-26 DIAGNOSIS — M9901 Segmental and somatic dysfunction of cervical region: Secondary | ICD-10-CM | POA: Diagnosis not present

## 2023-01-26 DIAGNOSIS — M9905 Segmental and somatic dysfunction of pelvic region: Secondary | ICD-10-CM | POA: Diagnosis not present

## 2023-01-26 DIAGNOSIS — M9903 Segmental and somatic dysfunction of lumbar region: Secondary | ICD-10-CM | POA: Diagnosis not present

## 2023-04-03 DIAGNOSIS — M9901 Segmental and somatic dysfunction of cervical region: Secondary | ICD-10-CM | POA: Diagnosis not present

## 2023-04-03 DIAGNOSIS — M9902 Segmental and somatic dysfunction of thoracic region: Secondary | ICD-10-CM | POA: Diagnosis not present

## 2023-04-03 DIAGNOSIS — M2011 Hallux valgus (acquired), right foot: Secondary | ICD-10-CM | POA: Diagnosis not present

## 2023-04-03 DIAGNOSIS — M9905 Segmental and somatic dysfunction of pelvic region: Secondary | ICD-10-CM | POA: Diagnosis not present

## 2023-04-03 DIAGNOSIS — M9903 Segmental and somatic dysfunction of lumbar region: Secondary | ICD-10-CM | POA: Diagnosis not present

## 2023-04-03 DIAGNOSIS — M2012 Hallux valgus (acquired), left foot: Secondary | ICD-10-CM | POA: Diagnosis not present

## 2023-05-01 DIAGNOSIS — M9905 Segmental and somatic dysfunction of pelvic region: Secondary | ICD-10-CM | POA: Diagnosis not present

## 2023-05-01 DIAGNOSIS — M9901 Segmental and somatic dysfunction of cervical region: Secondary | ICD-10-CM | POA: Diagnosis not present

## 2023-05-01 DIAGNOSIS — M9903 Segmental and somatic dysfunction of lumbar region: Secondary | ICD-10-CM | POA: Diagnosis not present

## 2023-05-01 DIAGNOSIS — M9902 Segmental and somatic dysfunction of thoracic region: Secondary | ICD-10-CM | POA: Diagnosis not present

## 2023-05-29 DIAGNOSIS — M9903 Segmental and somatic dysfunction of lumbar region: Secondary | ICD-10-CM | POA: Diagnosis not present

## 2023-05-29 DIAGNOSIS — M9902 Segmental and somatic dysfunction of thoracic region: Secondary | ICD-10-CM | POA: Diagnosis not present

## 2023-05-29 DIAGNOSIS — M9905 Segmental and somatic dysfunction of pelvic region: Secondary | ICD-10-CM | POA: Diagnosis not present

## 2023-05-29 DIAGNOSIS — M9901 Segmental and somatic dysfunction of cervical region: Secondary | ICD-10-CM | POA: Diagnosis not present

## 2023-06-29 DIAGNOSIS — M9902 Segmental and somatic dysfunction of thoracic region: Secondary | ICD-10-CM | POA: Diagnosis not present

## 2023-06-29 DIAGNOSIS — M9901 Segmental and somatic dysfunction of cervical region: Secondary | ICD-10-CM | POA: Diagnosis not present

## 2023-06-29 DIAGNOSIS — M9905 Segmental and somatic dysfunction of pelvic region: Secondary | ICD-10-CM | POA: Diagnosis not present

## 2023-06-29 DIAGNOSIS — M9903 Segmental and somatic dysfunction of lumbar region: Secondary | ICD-10-CM | POA: Diagnosis not present

## 2023-07-28 DIAGNOSIS — M9903 Segmental and somatic dysfunction of lumbar region: Secondary | ICD-10-CM | POA: Diagnosis not present

## 2023-07-28 DIAGNOSIS — M9902 Segmental and somatic dysfunction of thoracic region: Secondary | ICD-10-CM | POA: Diagnosis not present

## 2023-07-28 DIAGNOSIS — M9905 Segmental and somatic dysfunction of pelvic region: Secondary | ICD-10-CM | POA: Diagnosis not present

## 2023-07-28 DIAGNOSIS — M9901 Segmental and somatic dysfunction of cervical region: Secondary | ICD-10-CM | POA: Diagnosis not present

## 2023-08-24 DIAGNOSIS — M9903 Segmental and somatic dysfunction of lumbar region: Secondary | ICD-10-CM | POA: Diagnosis not present

## 2023-08-24 DIAGNOSIS — M9902 Segmental and somatic dysfunction of thoracic region: Secondary | ICD-10-CM | POA: Diagnosis not present

## 2023-08-24 DIAGNOSIS — M9905 Segmental and somatic dysfunction of pelvic region: Secondary | ICD-10-CM | POA: Diagnosis not present

## 2023-08-24 DIAGNOSIS — M9901 Segmental and somatic dysfunction of cervical region: Secondary | ICD-10-CM | POA: Diagnosis not present

## 2023-09-21 DIAGNOSIS — M9905 Segmental and somatic dysfunction of pelvic region: Secondary | ICD-10-CM | POA: Diagnosis not present

## 2023-09-21 DIAGNOSIS — M9903 Segmental and somatic dysfunction of lumbar region: Secondary | ICD-10-CM | POA: Diagnosis not present

## 2023-09-21 DIAGNOSIS — M9902 Segmental and somatic dysfunction of thoracic region: Secondary | ICD-10-CM | POA: Diagnosis not present

## 2023-09-21 DIAGNOSIS — M9901 Segmental and somatic dysfunction of cervical region: Secondary | ICD-10-CM | POA: Diagnosis not present

## 2023-09-23 DIAGNOSIS — N939 Abnormal uterine and vaginal bleeding, unspecified: Secondary | ICD-10-CM | POA: Diagnosis not present

## 2023-09-23 DIAGNOSIS — Z0000 Encounter for general adult medical examination without abnormal findings: Secondary | ICD-10-CM | POA: Diagnosis not present

## 2023-09-23 DIAGNOSIS — E559 Vitamin D deficiency, unspecified: Secondary | ICD-10-CM | POA: Diagnosis not present

## 2023-09-23 DIAGNOSIS — Z23 Encounter for immunization: Secondary | ICD-10-CM | POA: Diagnosis not present

## 2023-09-23 DIAGNOSIS — Z13220 Encounter for screening for lipoid disorders: Secondary | ICD-10-CM | POA: Diagnosis not present

## 2023-09-23 DIAGNOSIS — R519 Headache, unspecified: Secondary | ICD-10-CM | POA: Diagnosis not present

## 2023-09-23 DIAGNOSIS — K649 Unspecified hemorrhoids: Secondary | ICD-10-CM | POA: Diagnosis not present

## 2023-09-23 DIAGNOSIS — Z124 Encounter for screening for malignant neoplasm of cervix: Secondary | ICD-10-CM | POA: Diagnosis not present

## 2023-09-23 DIAGNOSIS — R7303 Prediabetes: Secondary | ICD-10-CM | POA: Diagnosis not present

## 2023-09-23 DIAGNOSIS — Z1211 Encounter for screening for malignant neoplasm of colon: Secondary | ICD-10-CM | POA: Diagnosis not present

## 2023-10-19 DIAGNOSIS — M9903 Segmental and somatic dysfunction of lumbar region: Secondary | ICD-10-CM | POA: Diagnosis not present

## 2023-10-19 DIAGNOSIS — M9905 Segmental and somatic dysfunction of pelvic region: Secondary | ICD-10-CM | POA: Diagnosis not present

## 2023-10-19 DIAGNOSIS — M9902 Segmental and somatic dysfunction of thoracic region: Secondary | ICD-10-CM | POA: Diagnosis not present

## 2023-10-19 DIAGNOSIS — M9901 Segmental and somatic dysfunction of cervical region: Secondary | ICD-10-CM | POA: Diagnosis not present

## 2023-10-22 DIAGNOSIS — L299 Pruritus, unspecified: Secondary | ICD-10-CM | POA: Diagnosis not present

## 2023-10-22 DIAGNOSIS — L819 Disorder of pigmentation, unspecified: Secondary | ICD-10-CM | POA: Diagnosis not present

## 2023-10-22 DIAGNOSIS — R208 Other disturbances of skin sensation: Secondary | ICD-10-CM | POA: Diagnosis not present
